# Patient Record
Sex: Male | Born: 1955 | Race: White | Hispanic: No | Marital: Married | Smoking: Never smoker
Health system: Southern US, Community
[De-identification: ages and names within clinical notes are randomized; demographics above are authoritative.]

## PROBLEM LIST (undated history)

## (undated) DIAGNOSIS — R0609 Other forms of dyspnea: Secondary | ICD-10-CM

## (undated) DIAGNOSIS — E039 Hypothyroidism, unspecified: Secondary | ICD-10-CM

## (undated) DIAGNOSIS — F32A Depression, unspecified: Secondary | ICD-10-CM

## (undated) DIAGNOSIS — Z6831 Body mass index (BMI) 31.0-31.9, adult: Secondary | ICD-10-CM

## (undated) DIAGNOSIS — I10 Essential (primary) hypertension: Secondary | ICD-10-CM

## (undated) DIAGNOSIS — K08109 Complete loss of teeth, unspecified cause, unspecified class: Secondary | ICD-10-CM

## (undated) DIAGNOSIS — E669 Obesity, unspecified: Secondary | ICD-10-CM

## (undated) DIAGNOSIS — Z973 Presence of spectacles and contact lenses: Secondary | ICD-10-CM

## (undated) DIAGNOSIS — Z794 Long term (current) use of insulin: Secondary | ICD-10-CM

## (undated) DIAGNOSIS — E782 Mixed hyperlipidemia: Secondary | ICD-10-CM

## (undated) DIAGNOSIS — G4730 Sleep apnea, unspecified: Secondary | ICD-10-CM

## (undated) DIAGNOSIS — E119 Type 2 diabetes mellitus without complications: Secondary | ICD-10-CM

## (undated) DIAGNOSIS — E079 Disorder of thyroid, unspecified: Secondary | ICD-10-CM

## (undated) HISTORY — DX: Type 2 diabetes mellitus without complications: Z79.4

## (undated) HISTORY — DX: Obesity, unspecified: E66.9

## (undated) HISTORY — DX: Body mass index (BMI) 31.0-31.9, adult: Z68.31

## (undated) HISTORY — DX: Mixed hyperlipidemia: E78.2

## (undated) HISTORY — DX: Other forms of dyspnea: R06.09

## (undated) HISTORY — DX: Essential (primary) hypertension: I10

## (undated) HISTORY — DX: Type 2 diabetes mellitus without complications: E11.9

---

## 2020-08-19 ENCOUNTER — Ambulatory Visit (INDEPENDENT_AMBULATORY_CARE_PROVIDER_SITE_OTHER)

## 2020-08-19 ENCOUNTER — Ambulatory Visit: Admission: EM | Admit: 2020-08-19 | Discharge: 2020-08-19 | Disposition: A | Discharge status: Home / Self Care

## 2020-08-19 ENCOUNTER — Other Ambulatory Visit: Payer: Self-pay

## 2020-08-19 ENCOUNTER — Encounter: Payer: Self-pay | Admitting: Emergency Medicine

## 2020-08-19 DIAGNOSIS — J988 Other specified respiratory disorders: Secondary | ICD-10-CM

## 2020-08-19 DIAGNOSIS — R059 Cough, unspecified: Secondary | ICD-10-CM | POA: Diagnosis not present

## 2020-08-19 DIAGNOSIS — R0602 Shortness of breath: Secondary | ICD-10-CM

## 2020-08-19 DIAGNOSIS — B9789 Other viral agents as the cause of diseases classified elsewhere: Secondary | ICD-10-CM

## 2020-08-19 DIAGNOSIS — R070 Pain in throat: Secondary | ICD-10-CM

## 2020-08-19 HISTORY — DX: Type 2 diabetes mellitus without complications: E11.9

## 2020-08-19 HISTORY — DX: Disorder of thyroid, unspecified: E07.9

## 2020-08-19 MED ORDER — PROMETHAZINE-DM 6.25-15 MG/5ML PO SYRP: 5.0000 mL | ORAL_SOLUTION | Freq: Every evening | ORAL | 0 refills | Status: DC | PRN

## 2020-08-19 MED ORDER — CETIRIZINE HCL 10 MG PO TABS: 10.0000 mg | ORAL_TABLET | Freq: Every day | ORAL | 0 refills | Status: DC

## 2020-08-19 MED ORDER — BENZONATATE 100 MG PO CAPS: 100.0000 mg | ORAL_CAPSULE | Freq: Three times a day (TID) | ORAL | 0 refills | Status: DC | PRN

## 2020-08-19 NOTE — Discharge Instructions (Signed)

## 2020-08-19 NOTE — ED Provider Notes (Signed)
[facility]   MRN: [id number] DOB: 10/28/1955  Subjective:   Jaqua Ching is a 65 y.o. male presenting for 2 to 3-day history of shortness of breath, cough, throat pain.  Patient states that he actually does have a longstanding history of shortness of breath and is followed by his doctor regularly for this.  Denies history of smoking, lung disorders.  Patient is a diabetic, has thyroid disorder.  Takes medications compliantly for this.  Reports there was one sick contact indirectly through one of his coworkers as one of his family members was sick but did not have Covid.  Patient denies runny or stuffy nose, ear pain, chest pain, belly pain, body aches, rash.  Has not tried medications for relief.  No current facility-administered medications for this encounter.  Current Outpatient Medications:  .  ezetimibe (ZETIA) 10 MG tablet, , Disp: , Rfl:  .  lisinopril (ZESTRIL) 5 MG tablet, , Disp: , Rfl:  .  atorvastatin (LIPITOR) 40 MG tablet, Take 1 tablet by mouth daily., Disp: , Rfl:  .  BYDUREON BCISE 2 MG/0.85ML AUIJ, Inject into the skin., Disp: , Rfl:  .  insulin glargine (LANTUS) 100 UNIT/ML injection, Inject into the skin., Disp: , Rfl:  .  METFORMIN HCL PO, Take 500 mg by mouth 2 (two) times daily., Disp: , Rfl:  .  pioglitazone (ACTOS) 15 MG tablet, Take by mouth., Disp: , Rfl:  .  SYNTHROID 88 MCG tablet, Take 88 mcg by mouth daily., Disp: , Rfl:  .  VENLAFAXINE HCL PO, Take 75 mg by mouth., Disp: , Rfl:    No Known Allergies  Past Medical History:  Diagnosis Date  . Diabetes mellitus without complication (HCC)   . Thyroid disorder      History reviewed. No pertinent surgical history.  No family history on file.  Social History   Tobacco Use  . Smoking status: Never Smoker  . Smokeless tobacco: Never Used  Vaping Use  . Vaping Use: Never used  Substance Use Topics  . Alcohol use: Yes    Comment: social  . Drug use: Never    ROS   Objective:    Vitals: BP 127/70 (BP Location: Right Arm)   Pulse 77   Temp 99.4 F (37.4 C) (Oral)   Resp 18   SpO2 94%   Physical Exam Constitutional:      General: He is not in acute distress.    Appearance: Normal appearance. He is well-developed. He is not ill-appearing, toxic-appearing or diaphoretic.  HENT:     Head: Normocephalic and atraumatic.     Right Ear: External ear normal.     Left Ear: External ear normal.     Nose: Nose normal.     Mouth/Throat:     Mouth: Mucous membranes are moist.     Pharynx: Oropharynx is clear. No oropharyngeal exudate or posterior oropharyngeal erythema.  Eyes:     General: No scleral icterus.       Right eye: No discharge.        Left eye: No discharge.     Extraocular Movements: Extraocular movements intact.     Conjunctiva/sclera: Conjunctivae normal.     Pupils: Pupils are equal, round, and reactive to light.  Cardiovascular:     Rate and Rhythm: Normal rate and regular rhythm.     Heart sounds: Normal heart sounds. No murmur heard. No friction rub. No gallop.   Pulmonary:     Effort: Pulmonary effort is normal. No  respiratory distress.     Breath sounds: No stridor. No wheezing, rhonchi or rales.     Comments: Slightly decreased lung sounds. Neurological:     Mental Status: He is alert and oriented to person, place, and time.  Psychiatric:        Mood and Affect: Mood normal.        Behavior: Behavior normal.        Thought Content: Thought content normal.        Judgment: Judgment normal.     DG Chest 2 View  Result Date: 08/19/2020 CLINICAL DATA:  Cough and shortness of breath beginning yesterday. EXAM: CHEST - 2 VIEW COMPARISON:  06/10/2019 FINDINGS: Lungs are adequately inflated and otherwise clear. Cardiomediastinal silhouette and remainder of the exam is unchanged. IMPRESSION: No active cardiopulmonary disease. Electronically Signed   By: Elberta Fortis M.D.   On: 08/19/2020 15:50     Assessment and Plan :   PDMP not  reviewed this encounter.  1. Viral respiratory illness   2. Cough   3. Throat pain   4. Shortness of breath     Will manage for viral illness such as viral URI, viral syndrome, viral rhinitis, COVID-19. Counseled patient on nature of COVID-19 including modes of transmission, diagnostic testing, management and supportive care.  Offered scripts for symptomatic relief. COVID 19 testing is pending. Counseled patient on potential for adverse effects with medications prescribed/recommended today, ER and return-to-clinic precautions discussed, patient verbalized understanding.     Wallis Bamberg, [place] 08/19/20 [id number]

## 2020-08-19 NOTE — ED Triage Notes (Signed)
Pt is present today with c/o a sore throat and a cough started yesterday. Pt denies any fever.

## 2020-08-20 LAB — NOVEL CORONAVIRUS, NAA: SARS-CoV-2, NAA: NOT DETECTED

## 2020-08-20 LAB — SARS-COV-2, NAA 2 DAY TAT

## 2020-08-24 ENCOUNTER — Telehealth: Payer: Self-pay

## 2020-08-24 NOTE — Telephone Encounter (Signed)
Pt is calling to receive his negative covid test results.

## 2021-05-10 ENCOUNTER — Other Ambulatory Visit: Payer: Self-pay | Admitting: Urology

## 2021-05-14 ENCOUNTER — Other Ambulatory Visit: Payer: Self-pay

## 2021-05-14 ENCOUNTER — Encounter (HOSPITAL_BASED_OUTPATIENT_CLINIC_OR_DEPARTMENT_OTHER): Payer: Self-pay | Admitting: Urology

## 2021-05-14 NOTE — Progress Notes (Signed)
Spoke w/ via phone for pre-op interview---pt  Lab needs dos----  I Stat and EKG             Lab results------n/a COVID test -----patient states asymptomatic no test needed Arrive at -------1000 on 05/21/2021 NPO after MN NO Solid Food.  Clear liquids from MN until---0900 05/21/2021 Med rec completed Medications to take morning of surgery -----synthroid and venlafaxine Diabetic medication -----none morning of surgery and 25 units night before surgery Patient instructed no nail polish to be worn day of surgery Patient instructed to bring photo id and insurance card day of surgery Patient aware to have Driver (ride ) / caregiver wife Dean Leon   for 24 hours after surgery  Patient Special Instructions -----n/a  Pre-Op special Istructions -----n/a Patient verbalized understanding of instructions that were given at this phone interview. Patient denies shortness of breath, chest pain, fever, cough at this phone interview.

## 2021-05-19 NOTE — Anesthesia Preprocedure Evaluation (Addendum)
Anesthesia Evaluation  Patient identified by MRN, date of birth, ID band Patient awake    Reviewed: Allergy & Precautions, NPO status , Patient's Chart, lab work & pertinent test results  Airway Mallampati: II  TM Distance: >3 FB Neck ROM: Full    Dental no notable dental hx. (+) Teeth Intact, Dental Advisory Given   Pulmonary sleep apnea ,    Pulmonary exam normal breath sounds clear to auscultation       Cardiovascular Exercise Tolerance: Good Normal cardiovascular exam Rhythm:Regular Rate:Normal     Neuro/Psych PSYCHIATRIC DISORDERS Depression    GI/Hepatic negative GI ROS, Neg liver ROS,   Endo/Other  diabetesHypothyroidism   Renal/GU negative Renal ROS     Musculoskeletal negative musculoskeletal ROS (+)   Abdominal (+) + obese (BMI 32.08),   Peds  Hematology   Anesthesia Other Findings   Reproductive/Obstetrics                            Anesthesia Physical Anesthesia Plan  ASA: 2  Anesthesia Plan: General   Post-op Pain Management: Tylenol PO (pre-op)   Induction: Intravenous  PONV Risk Score and Plan: 3 and Treatment may vary due to age or medical condition, Ondansetron, Midazolam and Dexamethasone  Airway Management Planned: LMA  Additional Equipment: None  Intra-op Plan:   Post-operative Plan:   Informed Consent:     Dental advisory given  Plan Discussed with:   Anesthesia Plan Comments:        Anesthesia Quick Evaluation

## 2021-05-20 NOTE — H&P (Signed)
Patient is a 66 year old white male who is uncircumcised. Over the last several years he has been unable to retract the foreskin. This causes pain with sexual activity and he has difficulty cleaning under foreskin. He is now here for consultation regarding possible circumcision.     ALLERGIES: None   MEDICATIONS: No Medications    GU PSH: None   NON-GU PSH: None   GU PMH: None   NON-GU PMH: Depression Diabetes Type 2 Hypercholesterolemia Hypertension Sleep Apnea    FAMILY HISTORY: Diabetes - Runs in Family   SOCIAL HISTORY: No Social History    REVIEW OF SYSTEMS:    GU Review Male:   Patient reports get up at night to urinate. Patient denies frequent urination, burning/ pain with urination, leakage of urine, stream starts and stops, trouble starting your stream, have to strain to urinate , erection problems, and penile pain.  Gastrointestinal (Upper):   Patient denies nausea, vomiting, and indigestion/ heartburn.  Gastrointestinal (Lower):   Patient denies diarrhea and constipation.  Constitutional:   Patient denies fever, night sweats, weight loss, and fatigue.  Skin:   Patient denies skin rash/ lesion and itching.  Eyes:   Patient denies double vision and blurred vision.  Ears/ Nose/ Throat:   Patient denies sore throat and sinus problems.  Hematologic/Lymphatic:   Patient denies swollen glands and easy bruising.  Cardiovascular:   Patient denies leg swelling and chest pains.  Respiratory:   Patient denies cough and shortness of breath.  Endocrine:   Patient denies excessive thirst.  Musculoskeletal:   Patient reports back pain and joint pain.   Neurological:   Patient denies headaches and dizziness.  Psychologic:   Patient denies depression and anxiety.   VITAL SIGNS:      05/08/2021 09:03 AM  Weight 230 lb / 104.33 kg  Height 71 in / 180.34 cm  BP 130/74 mmHg  Pulse 65 /min  Temperature 97.0 F / 36.1 C  BMI 32.1 kg/m   GU PHYSICAL EXAMINATION:    Scrotum: No  lesions. No edema. No cysts. No warts.  Penis: Penis is uncircumcised. Patient has tight phimosis and I cannot retract the foreskin. I did do not palpate any glanular lesions underneath the foreskin.   MULTI-SYSTEM PHYSICAL EXAMINATION:    Constitutional: Obese. No physical deformities. Normally developed. Good grooming.   Neck: Neck symmetrical, not swollen. Normal tracheal position.  Respiratory: No labored breathing, no use of accessory muscles.   Cardiovascular: Normal temperature, normal extremity pulses, no swelling, no varicosities.  Lymphatic: No enlargement of neck, axillae, groin.  Skin: No paleness, no jaundice, no cyanosis. No lesion, no ulcer, no rash.  Neurologic / Psychiatric: Oriented to time, oriented to place, oriented to person. No depression, no anxiety, no agitation.  Eyes: Normal conjunctivae. Normal eyelids.  Ears, Nose, Mouth, and Throat: Left ear no scars, no lesions, no masses. Right ear no scars, no lesions, no masses. Nose no scars, no lesions, no masses. Normal hearing. Normal lips.  Musculoskeletal: Normal gait and station of head and neck.     Complexity of Data:  Source Of History:  Patient  Records Review:   Previous Doctor Records, Previous Patient Records  Urine Test Review:   Urinalysis   PROCEDURES:          Urinalysis - 81003 Dipstick Dipstick Cont'd  Color: Yellow Bilirubin: Neg mg/dL  Appearance: Clear Ketones: Neg mg/dL  Specific Gravity: 7.371 Blood: Neg ery/uL  pH: <=5.0 Protein: Neg mg/dL  Glucose: Neg mg/dL Urobilinogen:  0.2 mg/dL    Nitrites: Neg    Leukocyte Esterase: Neg leu/uL    ASSESSMENT:      ICD-10 Details  1 GU:   Phimosis - N47.1 Acute, Complicated Injury   PLAN:           Document Letter(s):  Created for Patient: Clinical Summary         Notes:   We discussed treatment options, basically recommended circumcision as foreskin cannot be retracted. We discussed risks and benefits as outlined below. We will schedule  accordingly after the first of the year.  Circumcision consent: I have discussed with the patient the risks and benefits of the procedure including but not limited to bleeding, infection, damage to adjacent structures including the urethra wth possible need for further procedures. Risk of numbness and decreased sensation of the penis, scarring of the penile skin and pain associated with scarring. I have also discussed with the patient the alternatives to circumcision. Patient voices understanding of the risks and benefits of the above procedure and consents.

## 2021-05-21 ENCOUNTER — Ambulatory Visit (HOSPITAL_BASED_OUTPATIENT_CLINIC_OR_DEPARTMENT_OTHER)
Admission: RE | Admit: 2021-05-21 | Discharge: 2021-05-21 | Disposition: A | Discharge status: Home / Self Care | Payer: Medicare Other | Source: Ambulatory Visit | Attending: Urology | Admitting: Urology

## 2021-05-21 ENCOUNTER — Encounter (HOSPITAL_BASED_OUTPATIENT_CLINIC_OR_DEPARTMENT_OTHER)
Admission: RE | Disposition: A | Discharge status: Home / Self Care | Payer: Self-pay | Source: Ambulatory Visit | Attending: Urology

## 2021-05-21 ENCOUNTER — Encounter (HOSPITAL_BASED_OUTPATIENT_CLINIC_OR_DEPARTMENT_OTHER): Payer: Self-pay | Admitting: Urology

## 2021-05-21 ENCOUNTER — Ambulatory Visit (HOSPITAL_BASED_OUTPATIENT_CLINIC_OR_DEPARTMENT_OTHER): Payer: Medicare Other | Admitting: Anesthesiology

## 2021-05-21 DIAGNOSIS — F32A Depression, unspecified: Secondary | ICD-10-CM | POA: Insufficient documentation

## 2021-05-21 DIAGNOSIS — E119 Type 2 diabetes mellitus without complications: Secondary | ICD-10-CM | POA: Insufficient documentation

## 2021-05-21 DIAGNOSIS — E039 Hypothyroidism, unspecified: Secondary | ICD-10-CM | POA: Insufficient documentation

## 2021-05-21 DIAGNOSIS — N471 Phimosis: Secondary | ICD-10-CM | POA: Diagnosis present

## 2021-05-21 DIAGNOSIS — G4730 Sleep apnea, unspecified: Secondary | ICD-10-CM | POA: Insufficient documentation

## 2021-05-21 HISTORY — DX: Presence of spectacles and contact lenses: Z97.3

## 2021-05-21 HISTORY — DX: Presence of dental prosthetic device (complete) (partial): K08.109

## 2021-05-21 HISTORY — DX: Sleep apnea, unspecified: G47.30

## 2021-05-21 HISTORY — DX: Hypothyroidism, unspecified: E03.9

## 2021-05-21 HISTORY — PX: CIRCUMCISION: SHX1350

## 2021-05-21 HISTORY — DX: Depression, unspecified: F32.A

## 2021-05-21 LAB — POCT I-STAT, CHEM 8
BUN: 20 mg/dL (ref 8–23)
Calcium, Ion: 1.23 mmol/L (ref 1.15–1.40)
Chloride: 103 mmol/L (ref 98–111)
Creatinine, Ser: 0.6 mg/dL — ABNORMAL LOW (ref 0.61–1.24)
Glucose, Bld: 170 mg/dL — ABNORMAL HIGH (ref 70–99)
HCT: 44 % (ref 39.0–52.0)
Hemoglobin: 15 g/dL (ref 13.0–17.0)
Potassium: 4.3 mmol/L (ref 3.5–5.1)
Sodium: 138 mmol/L (ref 135–145)
TCO2: 27 mmol/L (ref 22–32)

## 2021-05-21 LAB — GLUCOSE, CAPILLARY: Glucose-Capillary: 145 mg/dL — ABNORMAL HIGH (ref 70–99)

## 2021-05-21 SURGERY — CIRCUMCISION, ADULT
Anesthesia: General | Site: Penis

## 2021-05-21 MED ORDER — ACETAMINOPHEN 10 MG/ML IV SOLN: 1000.0000 mg | Freq: Once | INTRAVENOUS | Status: DC | PRN

## 2021-05-21 MED ORDER — LIDOCAINE 2% (20 MG/ML) 5 ML SYRINGE
INTRAMUSCULAR | Status: DC | PRN
  Administered 2021-05-21: 80 mg via INTRAVENOUS

## 2021-05-21 MED ORDER — OXYCODONE HCL 5 MG PO TABS: 5.0000 mg | ORAL_TABLET | Freq: Once | ORAL | Status: DC | PRN

## 2021-05-21 MED ORDER — BUPIVACAINE HCL (PF) 0.5 % IJ SOLN
INTRAMUSCULAR | Status: AC
  Filled 2021-05-21: qty 30

## 2021-05-21 MED ORDER — 0.9 % SODIUM CHLORIDE (POUR BTL) OPTIME
TOPICAL | Status: DC | PRN
  Administered 2021-05-21: 500 mL

## 2021-05-21 MED ORDER — PROPOFOL 10 MG/ML IV BOLUS
INTRAVENOUS | Status: AC
  Filled 2021-05-21: qty 20

## 2021-05-21 MED ORDER — FENTANYL CITRATE (PF) 100 MCG/2ML IJ SOLN
INTRAMUSCULAR | Status: AC
  Filled 2021-05-21: qty 2

## 2021-05-21 MED ORDER — ONDANSETRON HCL 4 MG/2ML IJ SOLN: 4.0000 mg | Freq: Once | INTRAMUSCULAR | Status: DC | PRN

## 2021-05-21 MED ORDER — ACETAMINOPHEN 500 MG PO TABS
ORAL_TABLET | ORAL | Status: AC
  Filled 2021-05-21: qty 2

## 2021-05-21 MED ORDER — FENTANYL CITRATE (PF) 250 MCG/5ML IJ SOLN
INTRAMUSCULAR | Status: AC
  Filled 2021-05-21: qty 5

## 2021-05-21 MED ORDER — FENTANYL CITRATE (PF) 100 MCG/2ML IJ SOLN
INTRAMUSCULAR | Status: DC | PRN
  Administered 2021-05-21 (×3): 50 ug via INTRAVENOUS

## 2021-05-21 MED ORDER — CEFAZOLIN SODIUM-DEXTROSE 2-4 GM/100ML-% IV SOLN
INTRAVENOUS | Status: AC
  Filled 2021-05-21: qty 100

## 2021-05-21 MED ORDER — BUPIVACAINE HCL 0.5 % IJ SOLN
INTRAMUSCULAR | Status: DC | PRN
  Administered 2021-05-21 (×2): 10 mL

## 2021-05-21 MED ORDER — MIDAZOLAM HCL 2 MG/2ML IJ SOLN
INTRAMUSCULAR | Status: AC
  Filled 2021-05-21: qty 2

## 2021-05-21 MED ORDER — MIDAZOLAM HCL 5 MG/5ML IJ SOLN
INTRAMUSCULAR | Status: DC | PRN
  Administered 2021-05-21: 2 mg via INTRAVENOUS

## 2021-05-21 MED ORDER — DEXAMETHASONE SODIUM PHOSPHATE 10 MG/ML IJ SOLN
INTRAMUSCULAR | Status: AC
  Filled 2021-05-21: qty 1

## 2021-05-21 MED ORDER — DEXAMETHASONE SODIUM PHOSPHATE 10 MG/ML IJ SOLN
INTRAMUSCULAR | Status: DC | PRN
  Administered 2021-05-21: 10 mg via INTRAVENOUS

## 2021-05-21 MED ORDER — CEFAZOLIN SODIUM-DEXTROSE 2-4 GM/100ML-% IV SOLN
2.0000 g | INTRAVENOUS | Status: AC
  Administered 2021-05-21: 2 g via INTRAVENOUS

## 2021-05-21 MED ORDER — EPHEDRINE SULFATE-NACL 50-0.9 MG/10ML-% IV SOSY
PREFILLED_SYRINGE | INTRAVENOUS | Status: DC | PRN
  Administered 2021-05-21 (×2): 5 mg via INTRAVENOUS

## 2021-05-21 MED ORDER — HYDROMORPHONE HCL 1 MG/ML IJ SOLN: 0.2500 mg | INTRAMUSCULAR | Status: DC | PRN

## 2021-05-21 MED ORDER — PROPOFOL 10 MG/ML IV BOLUS
INTRAVENOUS | Status: DC | PRN
  Administered 2021-05-21: 50 mg via INTRAVENOUS
  Administered 2021-05-21: 150 mg via INTRAVENOUS

## 2021-05-21 MED ORDER — LIDOCAINE 2% (20 MG/ML) 5 ML SYRINGE
INTRAMUSCULAR | Status: AC
  Filled 2021-05-21: qty 5

## 2021-05-21 MED ORDER — ACETAMINOPHEN 500 MG PO TABS
1000.0000 mg | ORAL_TABLET | Freq: Once | ORAL | Status: AC
  Administered 2021-05-21: 1000 mg via ORAL

## 2021-05-21 MED ORDER — ONDANSETRON HCL 4 MG/2ML IJ SOLN
INTRAMUSCULAR | Status: AC
  Filled 2021-05-21: qty 2

## 2021-05-21 MED ORDER — TRAMADOL HCL 50 MG PO TABS: 50.0000 mg | ORAL_TABLET | Freq: Four times a day (QID) | ORAL | 0 refills | Status: AC | PRN

## 2021-05-21 MED ORDER — LACTATED RINGERS IV SOLN: INTRAVENOUS | Status: DC

## 2021-05-21 MED ORDER — OXYCODONE HCL 5 MG/5ML PO SOLN: 5.0000 mg | Freq: Once | ORAL | Status: DC | PRN

## 2021-05-21 MED ORDER — ONDANSETRON HCL 4 MG/2ML IJ SOLN
INTRAMUSCULAR | Status: DC | PRN
  Administered 2021-05-21: 4 mg via INTRAVENOUS

## 2021-05-21 MED ORDER — BACITRACIN-NEOMYCIN-POLYMYXIN 400-5-5000 EX OINT
TOPICAL_OINTMENT | CUTANEOUS | Status: DC | PRN
  Administered 2021-05-21: 1 via TOPICAL

## 2021-05-21 MED ORDER — AMISULPRIDE (ANTIEMETIC) 5 MG/2ML IV SOLN: 10.0000 mg | Freq: Once | INTRAVENOUS | Status: DC | PRN

## 2021-05-21 SURGICAL SUPPLY — 28 items
BLADE CLIPPER SENSICLIP SURGIC (BLADE) ×1 IMPLANT
BLADE SURG 15 STRL LF DISP TIS (BLADE) ×1 IMPLANT
BLADE SURG 15 STRL SS (BLADE) ×2
BNDG COHESIVE 1X5 TAN STRL LF (GAUZE/BANDAGES/DRESSINGS) ×1 IMPLANT
BNDG CONFORM 2 STRL LF (GAUZE/BANDAGES/DRESSINGS) ×2 IMPLANT
COVER BACK TABLE 60X90IN (DRAPES) ×2 IMPLANT
COVER MAYO STAND STRL (DRAPES) ×2 IMPLANT
DRAPE LAPAROTOMY 100X72 PEDS (DRAPES) ×2 IMPLANT
ELECT REM PT RETURN 9FT ADLT (ELECTROSURGICAL) ×2
ELECTRODE REM PT RTRN 9FT ADLT (ELECTROSURGICAL) ×1 IMPLANT
GAUZE 4X4 16PLY ~~LOC~~+RFID DBL (SPONGE) ×2 IMPLANT
GAUZE PETROLATUM 1 X8 (GAUZE/BANDAGES/DRESSINGS) ×2 IMPLANT
GLOVE SURG ENC MOIS LTX SZ7.5 (GLOVE) ×2 IMPLANT
GLOVE SURG UNDER POLY LF SZ7 (GLOVE) ×3 IMPLANT
GOWN STRL REUS W/TWL LRG LVL3 (GOWN DISPOSABLE) ×2 IMPLANT
GOWN STRL REUS W/TWL XL LVL3 (GOWN DISPOSABLE) ×2 IMPLANT
KIT TURNOVER CYSTO (KITS) ×2 IMPLANT
NDL HYPO 25X1 1.5 SAFETY (NEEDLE) IMPLANT
NEEDLE HYPO 25X1 1.5 SAFETY (NEEDLE) ×2 IMPLANT
NS IRRIG 500ML POUR BTL (IV SOLUTION) ×2 IMPLANT
PACK BASIN DAY SURGERY FS (CUSTOM PROCEDURE TRAY) ×2 IMPLANT
PENCIL SMOKE EVACUATOR (MISCELLANEOUS) ×2 IMPLANT
SOL PREP POV-IOD 4OZ 10% (MISCELLANEOUS) ×1 IMPLANT
SUT CHROMIC 3 0 SH 27 (SUTURE) ×2 IMPLANT
SUT CHROMIC 4 0 RB 1X27 (SUTURE) ×3 IMPLANT
SYR CONTROL 10ML LL (SYRINGE) ×1 IMPLANT
TOWEL OR 17X26 10 PK STRL BLUE (TOWEL DISPOSABLE) ×2 IMPLANT
TRAY DSU PREP LF (CUSTOM PROCEDURE TRAY) ×2 IMPLANT

## 2021-05-21 NOTE — Discharge Instructions (Signed)
NO TYLENOL CONTAINING PRODUCTS UNTIL AFTER 4:45 PM TODAY.      Circumcision-Home Care Instructions  The following instructions have been prepared to help you care for yourself upon your return home today.   Wound Care & Hygiene:   You may apply ice to the penis.  This may help to decrease swelling.  Remove the dressing tomorrow.  If the dressing falls off before then, leave it off.  You may shower or bathe in 48 hours  Gently wash the penis with soap and water.  The stitches do not need to be removed.  Activity:  Do not drive or operate any equipment today.  The effects of anesthesia are still present, drowsiness may result.  Rest today, not necessarily flat bed rest, just take it easy.  You may resume your normal activity in one to two days or as indicated by your physician.  Sexual Activity:  Erection and sexual relations should be avoided for 2 weeks.  Return to Work:  One to two days or as indicated by your physician.  Diet:  Drink liquids or eat a very light diet this evening.  You may resume a regular diet tomorrow.  General Expectations of your surgery:  You may have a small amount of bleeding The penis will be swollen and bruised for approximately one week You may wake during the night with an erection, usually this is caused by having a full bladder so you should try to urinate (pass your water) to relieve the erection or apply ice to the penis  Unexpected Observations - Call your doctor if these occur! Persistent or heavy bleeding Temperature of 101 degrees or more Severe pain not relieved by medication      Post Anesthesia Home Care Instructions  Activity: Get plenty of rest for the remainder of the day. A responsible individual must stay with you for 24 hours following the procedure.  For the next 24 hours, DO NOT: -Drive a car -Advertising copywriter -Drink alcoholic beverages -Take any medication unless instructed by your physician -Make any legal decisions  or sign important papers.  Meals: Start with liquid foods such as gelatin or soup. Progress to regular foods as tolerated. Avoid greasy, spicy, heavy foods. If nausea and/or vomiting occur, drink only clear liquids until the nausea and/or vomiting subsides. Call your physician if vomiting continues.  Special Instructions/Symptoms: Your throat may feel dry or sore from the anesthesia or the breathing tube placed in your throat during surgery. If this causes discomfort, gargle with warm salt water. The discomfort should disappear within 24 hours.

## 2021-05-21 NOTE — Interval H&P Note (Signed)
History and Physical Interval Note:  05/21/2021 9:32 AM  Dean Leon  has presented today for surgery, with the diagnosis of PHIMOSIS.  The various methods of treatment have been discussed with the patient and family. After consideration of risks, benefits and other options for treatment, the patient has consented to  Procedure(s): CIRCUMCISION ADULT (N/A) as a surgical intervention.  The patient's history has been reviewed, patient examined, no change in status, stable for surgery.  I have reviewed the patient's chart and labs.  Questions were answered to the patient's satisfaction.     [name]

## 2021-05-21 NOTE — Transfer of Care (Signed)
Immediate Anesthesia Transfer of Care Note  Patient: Dean Leon  Procedure(s) Performed: CIRCUMCISION ADULT (Penis)  Patient Location: PACU  Anesthesia Type:General  Level of Consciousness: awake, alert , oriented and patient cooperative  Airway & Oxygen Therapy: Patient Spontanous Breathing  Post-op Assessment: Report given to RN and Post -op Vital signs reviewed and stable  Post vital signs: Reviewed and stable  Last Vitals:  Vitals Value Taken Time  BP    Temp    Pulse 63 05/21/21 1230  Resp 12 05/21/21 1230  SpO2 89 % 05/21/21 1230  Vitals shown include unvalidated device data.  Last Pain:  Vitals:   05/21/21 0936  TempSrc: Oral  PainSc: 2       Patients Stated Pain Goal: 5 (05/21/21 0936)  Complications: No notable events documented.

## 2021-05-21 NOTE — Op Note (Signed)
Preoperative diagnosis:  1.  Phimosis  Postoperative diagnosis: 1.  Same  Procedure(s): 1.  Adult circumcision  Surgeon: Dr. Karoline Caldwell  Anesthesia: General  Complications: None  EBL: Minimal  Specimens: Penile foreskin  Disposition of specimens: To pathology  Intraoperative findings: Fibrotic foreskin circumcision performed without difficulty  Indication: Patient is a 66 year old white male with penile phimosis.  His presents now to undergo circumcision.  Description of procedure:  After obtaining informed consent for the patient was taken major or suite placed under general anesthesia.  Placed in supine position genitalia prepped and draped in usual sterile fashion.  Proper pause timeout was performed.  The foreskin could be retracted but it was somewhat difficult due to the tight fibrotic band.  Foreskin was pulled back over the head of the penis and a marking pen was utilized to outline the coronal sulcus which could be seen through the overlying foreskin.  Circumcising incision was then made over this line.  The foreskin was then retracted and similar circumcising incision was made approximately 2 to 3 mm beneath the coronal sulcus circumferentially.  The ring of skin between the 2 incisions were then sharply dissected free in the avascular plane and excised.  Hemostasis was obtained with Bovie cautery.  Foreskin of the shaft was then reapproximated to the skin beneath the coronal sulcus utilizing interrupted 4 and 3-0 chromic sutures.  Care was taken to align the frenular line ventrally.  In this way foreskin was completely reanastomosed to the foreskin.  Good hemostasis was noted.  A Vaseline nonstick dressing was placed along with Curlex 2 inch dressing and Coban compression dressing.  Half percent plain Marcaine block dorsal block was performed at termination of the case for postoperative anesthesia.  Patient was awake from anesthesia taken back to the recovery room in stable  condition.  No immediate complication

## 2021-05-21 NOTE — Anesthesia Procedure Notes (Signed)
Procedure Name: LMA Insertion Date/Time: 05/21/2021 11:30 AM Performed by: Rogers Blocker, CRNA Pre-anesthesia Checklist: Patient identified, Emergency Drugs available, Suction available and Patient being monitored Patient Re-evaluated:Patient Re-evaluated prior to induction Oxygen Delivery Method: Circle System Utilized Preoxygenation: Pre-oxygenation with 100% oxygen Induction Type: IV induction Ventilation: Mask ventilation without difficulty LMA: LMA inserted LMA Size: 5.0 Number of attempts: 1 Placement Confirmation: positive ETCO2 Tube secured with: Tape Dental Injury: Teeth and Oropharynx as per pre-operative assessment

## 2021-05-22 ENCOUNTER — Encounter (HOSPITAL_BASED_OUTPATIENT_CLINIC_OR_DEPARTMENT_OTHER): Payer: Self-pay | Admitting: Urology

## 2021-05-22 LAB — SURGICAL PATHOLOGY

## 2021-05-22 NOTE — Anesthesia Postprocedure Evaluation (Signed)
Anesthesia Post Note  Patient: Dean Leon  Procedure(s) Performed: CIRCUMCISION ADULT (Penis)     Patient location during evaluation: PACU Anesthesia Type: General Level of consciousness: awake and alert Pain management: pain level controlled Vital Signs Assessment: post-procedure vital signs reviewed and stable Respiratory status: spontaneous breathing, nonlabored ventilation, respiratory function stable and patient connected to nasal cannula oxygen Cardiovascular status: blood pressure returned to baseline and stable Postop Assessment: no apparent nausea or vomiting Anesthetic complications: no   No notable events documented.  Last Vitals:  Vitals:   05/21/21 1300 05/21/21 1324  BP: 125/77 129/75  Pulse: (!) 55 61  Resp: 10 14  Temp:  (!) 35.8 C  SpO2: 95% 93%    Last Pain:  Vitals:   05/22/21 0957  TempSrc:   PainSc: 1                  [name] L Asante Blanda

## 2021-07-05 ENCOUNTER — Other Ambulatory Visit: Payer: Self-pay

## 2022-03-02 IMAGING — DX DG CHEST 2V
2 series · 2 of 2 positions shown · non-contrast
Comparison: 06/10/2019

CLINICAL DATA: Cough and shortness of breath beginning yesterday.

EXAM:
CHEST - 2 VIEW

[chest pa]
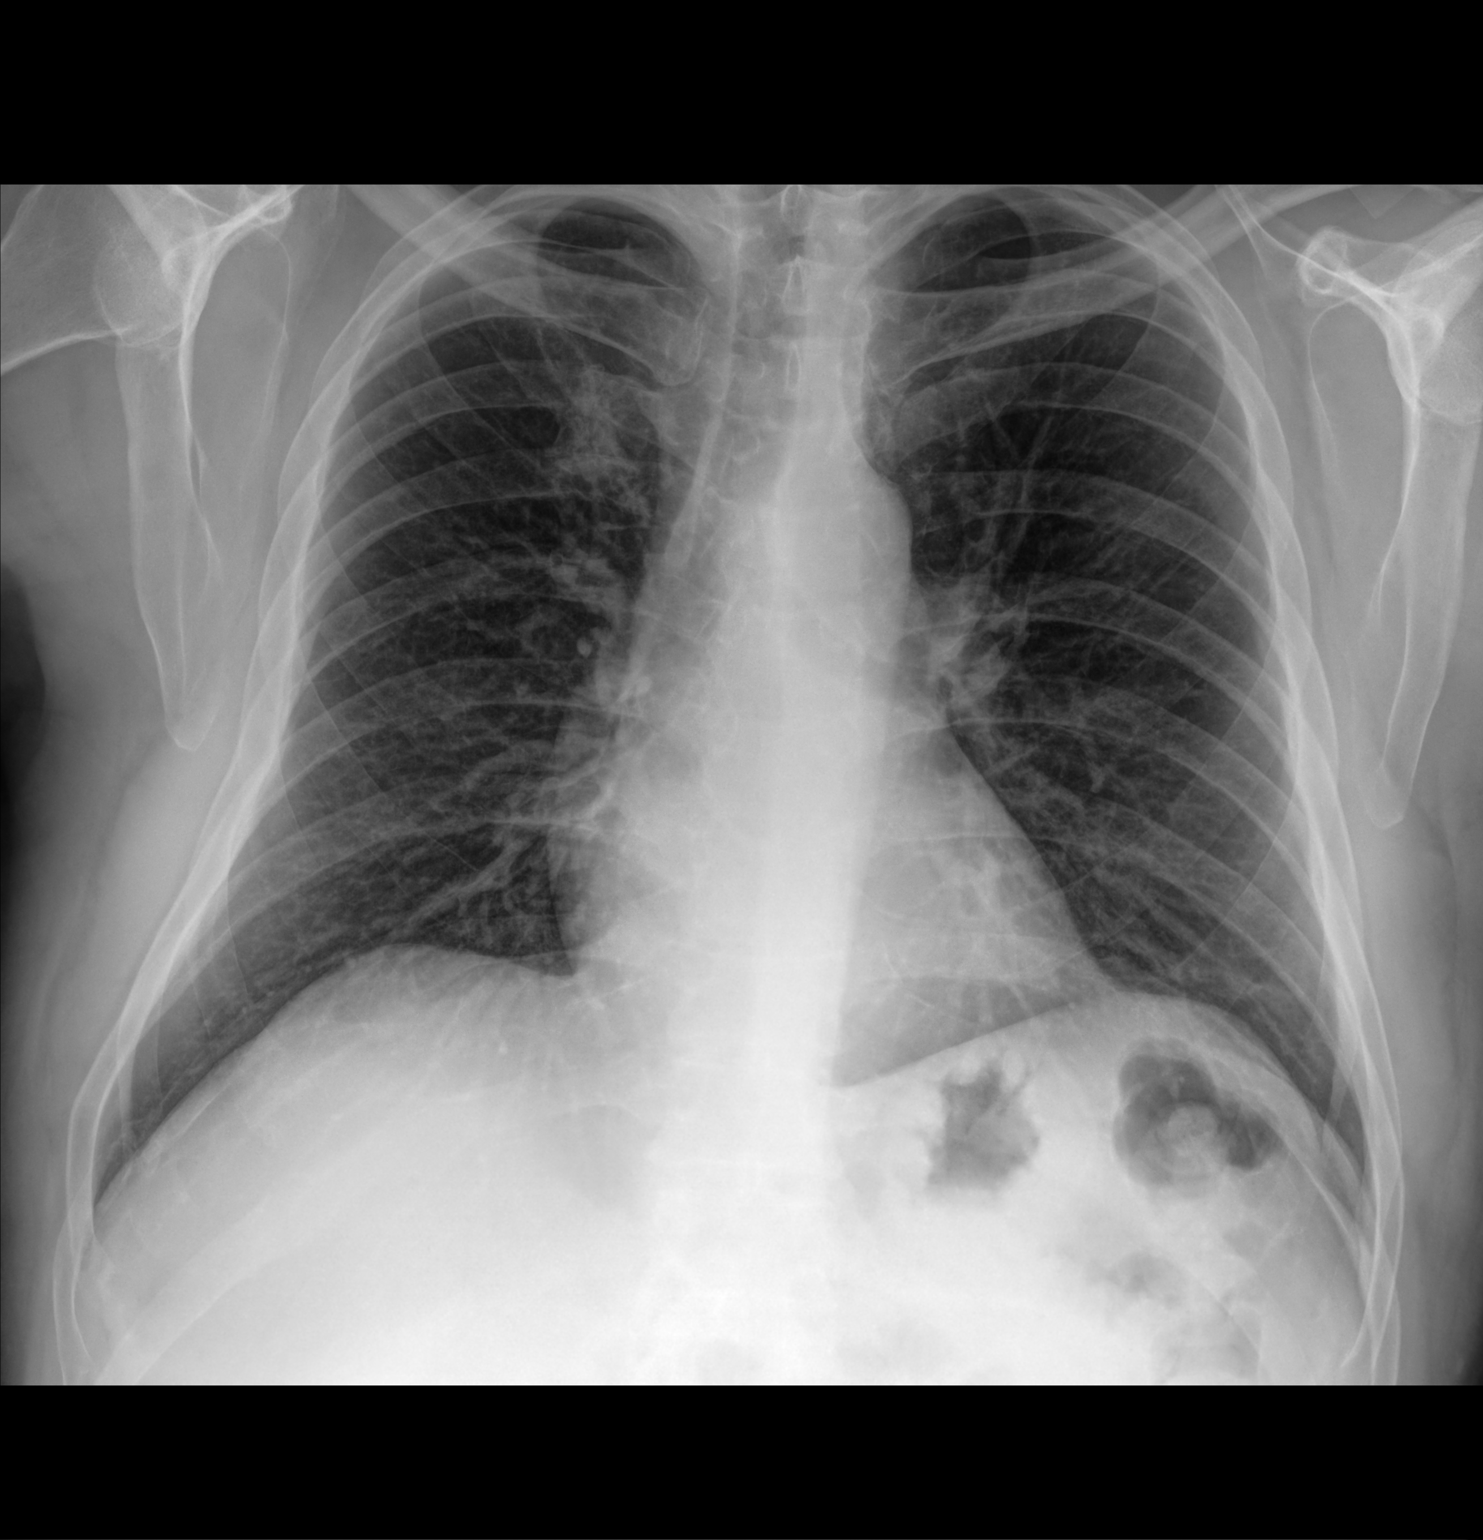

[chest lat]
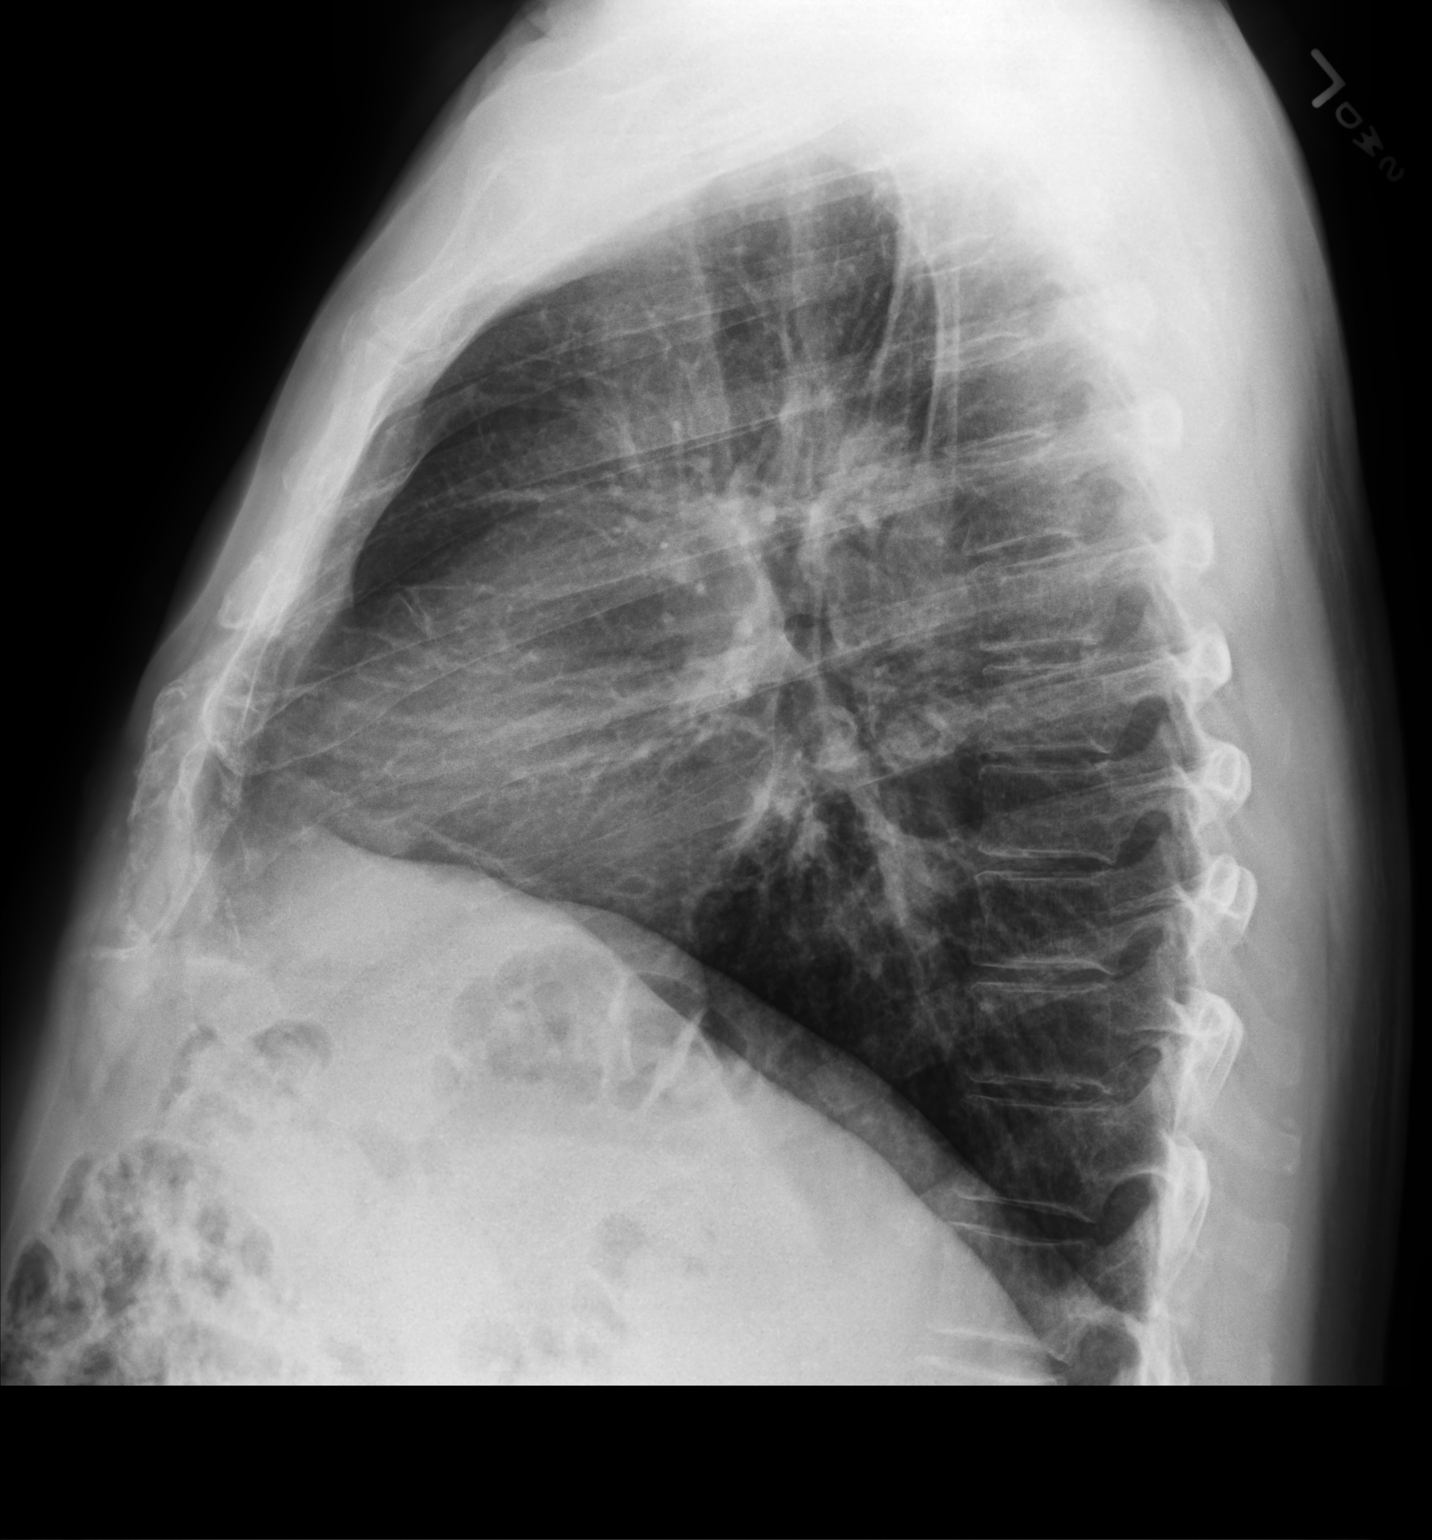

[2 of 2 positions shown; findings below may reference images not displayed]

FINDINGS: Lungs are adequately inflated and otherwise clear. Cardiomediastinal
silhouette and remainder of the exam is unchanged.
IMPRESSION: No active cardiopulmonary disease.

## 2023-02-03 DIAGNOSIS — Z139 Encounter for screening, unspecified: Secondary | ICD-10-CM | POA: Diagnosis not present

## 2023-02-03 DIAGNOSIS — E785 Hyperlipidemia, unspecified: Secondary | ICD-10-CM | POA: Diagnosis not present

## 2023-02-03 DIAGNOSIS — I10 Essential (primary) hypertension: Secondary | ICD-10-CM | POA: Diagnosis not present

## 2023-02-03 DIAGNOSIS — Z794 Long term (current) use of insulin: Secondary | ICD-10-CM | POA: Diagnosis not present

## 2023-02-03 DIAGNOSIS — E538 Deficiency of other specified B group vitamins: Secondary | ICD-10-CM | POA: Diagnosis not present

## 2023-02-03 DIAGNOSIS — E039 Hypothyroidism, unspecified: Secondary | ICD-10-CM | POA: Diagnosis not present

## 2023-02-03 DIAGNOSIS — Z9181 History of falling: Secondary | ICD-10-CM | POA: Diagnosis not present

## 2023-02-03 DIAGNOSIS — E559 Vitamin D deficiency, unspecified: Secondary | ICD-10-CM | POA: Diagnosis not present

## 2023-02-03 DIAGNOSIS — E1165 Type 2 diabetes mellitus with hyperglycemia: Secondary | ICD-10-CM | POA: Diagnosis not present

## 2023-02-16 DIAGNOSIS — Z79899 Other long term (current) drug therapy: Secondary | ICD-10-CM | POA: Diagnosis not present

## 2023-02-16 DIAGNOSIS — Z23 Encounter for immunization: Secondary | ICD-10-CM | POA: Diagnosis not present

## 2023-02-16 DIAGNOSIS — Z794 Long term (current) use of insulin: Secondary | ICD-10-CM | POA: Diagnosis not present

## 2023-02-16 DIAGNOSIS — F32A Depression, unspecified: Secondary | ICD-10-CM | POA: Diagnosis not present

## 2023-02-16 DIAGNOSIS — E119 Type 2 diabetes mellitus without complications: Secondary | ICD-10-CM | POA: Diagnosis not present

## 2023-02-16 DIAGNOSIS — E782 Mixed hyperlipidemia: Secondary | ICD-10-CM | POA: Diagnosis not present

## 2023-02-16 DIAGNOSIS — E039 Hypothyroidism, unspecified: Secondary | ICD-10-CM | POA: Diagnosis not present

## 2023-06-01 DIAGNOSIS — I10 Essential (primary) hypertension: Secondary | ICD-10-CM | POA: Diagnosis not present

## 2023-06-01 DIAGNOSIS — F32A Depression, unspecified: Secondary | ICD-10-CM | POA: Diagnosis not present

## 2023-06-01 DIAGNOSIS — E782 Mixed hyperlipidemia: Secondary | ICD-10-CM | POA: Diagnosis not present

## 2023-06-01 DIAGNOSIS — E119 Type 2 diabetes mellitus without complications: Secondary | ICD-10-CM | POA: Diagnosis not present

## 2023-06-01 DIAGNOSIS — E039 Hypothyroidism, unspecified: Secondary | ICD-10-CM | POA: Diagnosis not present

## 2023-06-01 DIAGNOSIS — Z794 Long term (current) use of insulin: Secondary | ICD-10-CM | POA: Diagnosis not present

## 2023-08-31 DIAGNOSIS — F32A Depression, unspecified: Secondary | ICD-10-CM | POA: Diagnosis not present

## 2023-08-31 DIAGNOSIS — E119 Type 2 diabetes mellitus without complications: Secondary | ICD-10-CM | POA: Diagnosis not present

## 2023-08-31 DIAGNOSIS — I10 Essential (primary) hypertension: Secondary | ICD-10-CM | POA: Diagnosis not present

## 2023-08-31 DIAGNOSIS — Z125 Encounter for screening for malignant neoplasm of prostate: Secondary | ICD-10-CM | POA: Diagnosis not present

## 2023-08-31 DIAGNOSIS — E039 Hypothyroidism, unspecified: Secondary | ICD-10-CM | POA: Diagnosis not present

## 2023-08-31 DIAGNOSIS — Z794 Long term (current) use of insulin: Secondary | ICD-10-CM | POA: Diagnosis not present

## 2023-08-31 DIAGNOSIS — E782 Mixed hyperlipidemia: Secondary | ICD-10-CM | POA: Diagnosis not present

## 2023-09-14 DIAGNOSIS — J342 Deviated nasal septum: Secondary | ICD-10-CM | POA: Diagnosis not present

## 2023-09-14 DIAGNOSIS — H9072 Mixed conductive and sensorineural hearing loss, unilateral, left ear, with unrestricted hearing on the contralateral side: Secondary | ICD-10-CM | POA: Diagnosis not present

## 2023-09-14 DIAGNOSIS — H9122 Sudden idiopathic hearing loss, left ear: Secondary | ICD-10-CM | POA: Diagnosis not present

## 2023-09-14 DIAGNOSIS — I10 Essential (primary) hypertension: Secondary | ICD-10-CM | POA: Diagnosis not present

## 2023-09-14 DIAGNOSIS — E039 Hypothyroidism, unspecified: Secondary | ICD-10-CM | POA: Diagnosis not present

## 2024-01-25 DIAGNOSIS — Z23 Encounter for immunization: Secondary | ICD-10-CM | POA: Diagnosis not present

## 2024-01-25 DIAGNOSIS — E119 Type 2 diabetes mellitus without complications: Secondary | ICD-10-CM | POA: Diagnosis not present

## 2024-01-25 DIAGNOSIS — F32A Depression, unspecified: Secondary | ICD-10-CM | POA: Diagnosis not present

## 2024-01-25 DIAGNOSIS — I10 Essential (primary) hypertension: Secondary | ICD-10-CM | POA: Diagnosis not present

## 2024-01-25 DIAGNOSIS — Z794 Long term (current) use of insulin: Secondary | ICD-10-CM | POA: Diagnosis not present

## 2024-01-25 DIAGNOSIS — E039 Hypothyroidism, unspecified: Secondary | ICD-10-CM | POA: Diagnosis not present

## 2024-01-25 DIAGNOSIS — E782 Mixed hyperlipidemia: Secondary | ICD-10-CM | POA: Diagnosis not present

## 2024-01-26 ENCOUNTER — Other Ambulatory Visit: Payer: Self-pay

## 2024-01-26 DIAGNOSIS — Z973 Presence of spectacles and contact lenses: Secondary | ICD-10-CM | POA: Insufficient documentation

## 2024-01-26 DIAGNOSIS — E039 Hypothyroidism, unspecified: Secondary | ICD-10-CM | POA: Insufficient documentation

## 2024-01-26 DIAGNOSIS — Z6831 Body mass index (BMI) 31.0-31.9, adult: Secondary | ICD-10-CM | POA: Insufficient documentation

## 2024-01-26 DIAGNOSIS — I10 Essential (primary) hypertension: Secondary | ICD-10-CM | POA: Insufficient documentation

## 2024-01-26 DIAGNOSIS — R0609 Other forms of dyspnea: Secondary | ICD-10-CM | POA: Insufficient documentation

## 2024-01-26 DIAGNOSIS — G4730 Sleep apnea, unspecified: Secondary | ICD-10-CM | POA: Insufficient documentation

## 2024-01-26 DIAGNOSIS — F32A Depression, unspecified: Secondary | ICD-10-CM | POA: Insufficient documentation

## 2024-01-26 DIAGNOSIS — E079 Disorder of thyroid, unspecified: Secondary | ICD-10-CM | POA: Insufficient documentation

## 2024-01-26 DIAGNOSIS — E119 Type 2 diabetes mellitus without complications: Secondary | ICD-10-CM | POA: Insufficient documentation

## 2024-01-26 DIAGNOSIS — E669 Obesity, unspecified: Secondary | ICD-10-CM | POA: Insufficient documentation

## 2024-01-26 DIAGNOSIS — Z794 Long term (current) use of insulin: Secondary | ICD-10-CM | POA: Insufficient documentation

## 2024-01-26 DIAGNOSIS — E782 Mixed hyperlipidemia: Secondary | ICD-10-CM | POA: Insufficient documentation

## 2024-01-26 DIAGNOSIS — K08109 Complete loss of teeth, unspecified cause, unspecified class: Secondary | ICD-10-CM | POA: Insufficient documentation

## 2024-01-27 ENCOUNTER — Ambulatory Visit

## 2024-01-27 VITALS — BP 124/70 | HR 65 | Ht 71.0 in | Wt 231.8 lb

## 2024-01-27 DIAGNOSIS — R600 Localized edema: Secondary | ICD-10-CM | POA: Diagnosis not present

## 2024-01-27 DIAGNOSIS — E782 Mixed hyperlipidemia: Secondary | ICD-10-CM | POA: Diagnosis not present

## 2024-01-27 DIAGNOSIS — R0609 Other forms of dyspnea: Secondary | ICD-10-CM | POA: Diagnosis not present

## 2024-01-27 HISTORY — DX: Localized edema: R60.0

## 2024-01-27 MED ORDER — ASPIRIN 81 MG PO TBEC: 81.0000 mg | DELAYED_RELEASE_TABLET | Freq: Every day | ORAL | 3 refills | Status: AC

## 2024-01-27 MED ORDER — METOPROLOL TARTRATE 50 MG PO TABS
50.0000 mg | ORAL_TABLET | Freq: Once | ORAL | 0 refills | Status: DC
Start: 2024-01-27 — End: 2024-03-25

## 2024-01-27 NOTE — Assessment & Plan Note (Signed)
 Likely multifactorial in the setting of progressive age-related deconditioning versus anginal equivalent versus diastolic/systolic dysfunction. Extracardiac causes secondary to pulmonary disease in the setting of chronic occupational exposure to toxins while in [employer].  - Given his overall cardiovascular risk factors we will proceed cardiac evaluation with transthoracic echocardiogram to assess cardiac structure and function and a cardiac CT coronary angiogram to rule out any significant obstructive CAD.  Advised him to start taking aspirin  81 mg once daily and let us  know if she has any side effect such as bleeding bruising or any other intolerance such as gastritis related symptoms.  Advised him to cut down on salt intake. Advised him to cut down on soda intake.

## 2024-01-27 NOTE — Progress Notes (Addendum)
 Cardiology Consultation:    Date:  01/27/2024   ID:  Dean Leon, DOB [birth year], MRN [id number]  PCP:  Stephanie Charlene LITTIE, MD  Cardiologist:  Alean JONELLE Kobus, MD   Referring MD: Stephanie Charlene LITTIE, MD   No chief complaint on file.    ASSESSMENT AND PLAN:   [name] 68 year old male patient with history of diabetes mellitus, hypertension, obesity, hyperlipidemia, hypothyroidism and depression.  Excess caffeinated soda consumption. Denies any prior history of CAD, CHF, MI, CVA. Mentions chronic dyspnea on exertion over the past 20 years but worse over the past year.  Problem List Items Addressed This Visit     Dyspnea on exertion - Primary   Likely multifactorial in the setting of progressive age-related deconditioning versus anginal equivalent versus diastolic/systolic dysfunction. Extracardiac causes secondary to pulmonary disease in the setting of chronic occupational exposure to toxins while in [employer].  - Given his overall cardiovascular risk factors we will proceed cardiac evaluation with transthoracic echocardiogram to assess cardiac structure and function and a cardiac CT coronary angiogram to rule out any significant obstructive CAD.  Advised him to start taking aspirin  81 mg once daily and let us  know if she has any side effect such as bleeding bruising or any other intolerance such as gastritis related symptoms.  Advised him to cut down on salt intake. Advised him to cut down on soda intake.        Relevant Orders   CT CORONARY MORPH W/CTA COR W/SCORE W/CA W/CM &/OR WO/CM   Basic Metabolic Panel (BMET)   ECHOCARDIOGRAM COMPLETE   Mixed hyperlipidemia   Lipid panel shows good control. Continue atorvastatin and Zetia.       Relevant Medications   aspirin  EC 81 MG tablet   Other Relevant Orders   EKG 12-Lead (Completed)   CT CORONARY MORPH W/CTA COR W/SCORE W/CA W/CM &/OR WO/CM   Basic Metabolic Panel (BMET)   ECHOCARDIOGRAM COMPLETE   Bilateral lower  extremity edema   Likely related to diastolic dysfunction and in the setting of longstanding history of hypertension. Advised to cut down salt intake. Will assess with transthoracic echocardiogram.        Addendum 02/29/2024: - Cardiac evaluation with echocardiogram 02/29/2024 notes normal biventricular function without significant valve abnormalities. - Cardiac CT coronary angiogram from 02/18/2024 shows no significant coronary atherosclerosis, calcium score 0, CAD RADS 0 study. Extracardiac findings are notable for heterogenous pulmonary parenchyma and bronchial thickening.  Could reflect findings of bronchitis. Also given his history of significant exposure to air pollutants, recommend further evaluation with PCP with regards to pulmonary causes of his dyspnea on exertion.  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  Continue to follow-up with his PCP and he can see us  on an as-needed basis.  History of Present Illness:    Dean Leon is a 68 y.o. male who is being seen today for the evaluation of dyspnea on exertion at the request of Hamrick, Charlene LITTIE, MD.  Pleasant gentleman here for the visit by himself.  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  Employed full-time.  He is a Research scientist (life sciences) and was in [facility] and reports exposure to significant air pollutants.  Has history of diabetes mellitus, hypertension, obesity, hyperlipidemia, hypothyroidism and depression. Denies any prior history of  CAD, CHF, MI, CVA. Mentions chronic dyspnea on exertion over the past 20 years but worse over the past year.  Mentions with progressive symptoms of dyspnea over the past year more noticeable and when he is walking his routine distances at this will going up and down inclines he tends to observe  getting easily tired and out of. At times associated with a sense of chest discomfort but denies any significant chest pain.  Denies any palpitations, lightheadedness, dizziness or syncopal episodes.  Does have mild bilateral pitting pedal edema which he mentions has been chronic.  Does eat salty foods, typically eats 1 meal but eats out on most days. Does not smoke. Drinks alcohol occasionally on social events but no over consumption. Reports no other substance abuse. Drinks 6 to 8 cans of 12 ounce diet sodas a day.  Mentions good compliance with medications. Previously took aspirin  full-strength and noted some bruising but self discontinued.  EKG in the clinic today shows sinus rhythm heart rate 65/min PR interval 154 ms, no ischemic changes.  Normal QRS duration 82 ms and normal QTc.  Labs from 01/25/2024 hemoglobin 14.7, hematocrit 41, platelets 302 and WBC 6.2. BUN 22, creatinine 0.8 and eGFR greater than 60. Sodium 139 and potassium 4.5. Calcium mildly elevated 10.8. Hemoglobin A1c elevated 10.3 TSH 3.7. Lipid panel with total cholesterol 99, triglycerides 71, HDL 44 and LDL 41.  Past Medical History:  Diagnosis Date   BMI 31.0-31.9,adult    Depression    Diabetes mellitus without complication (HCC)    Dyspnea on exertion    Full dentures    Hypertension    Hypothyroidism    Mixed hyperlipidemia    Obesity    Sleep apnea    had a study awhile back but doesnt use CPAP   Thyroid  disorder    Type 2 diabetes mellitus with insulin therapy (HCC)    Wears glasses    sometimes 05/14/2021    Past Surgical History:  Procedure Laterality Date   CIRCUMCISION N/A 05/21/2021   Procedure: CIRCUMCISION ADULT;  Surgeon: Rosalind Zachary NOVAK, MD;  Location: [facility];  Service: Urology;  Laterality: N/A;    Current Medications: Current Meds  Medication Sig   aspirin  EC 81 MG tablet Take 1 tablet (81 mg total) by mouth daily. Swallow whole.   atorvastatin  (LIPITOR) 40 MG tablet Take 1 tablet by mouth daily. Afternoon takes it   ezetimibe (ZETIA) 10 MG tablet Take 10 mg by mouth daily.   insulin glargine (LANTUS) 100 UNIT/ML injection Inject 70 Units into the skin at bedtime.   lisinopril (ZESTRIL) 5 MG tablet Take 5 mg by mouth daily.   metFORMIN (GLUCOPHAGE) 500 MG tablet Take 1,000 mg by mouth 2 (two) times daily.   pioglitazone (ACTOS) 45 MG tablet Take 45 mg by mouth daily.   SYNTHROID 88 MCG tablet Take 88 mcg by mouth daily.   venlafaxine XR (EFFEXOR-XR) 75 MG 24 hr capsule Take 75 mg by mouth daily.     Allergies:   Patient has no known allergies.   Social History   Socioeconomic History   Marital status: Married    Spouse name: Not on file   Number of children: Not on file   Years of education: Not on file   Highest education level: Not on file  Occupational History   Not on file  Tobacco Use   Smoking status: Never   Smokeless tobacco: Never  Vaping Use   Vaping status: Never Used  Substance and Sexual Activity   Alcohol use: Yes    Comment: social   Drug use: Never   Sexual activity: Not on file  Other Topics Concern   Not on file  Social History Narrative   Not on file   Social Drivers of Health   Financial Resource Strain: Not on file  Food Insecurity: Not on file  Transportation Needs: Not on file  Physical Activity: Not on file  Stress: Not on file  Social Connections: Not on file     Family History: The patient's family history includes Cancer in his paternal aunt and paternal uncle; Diabetes in his father, mother, paternal grandmother, and sister; Hypertension in his father and paternal grandmother. There is no history of Heart disease. ROS:   Please see the history of present illness.    All 14 point review of systems negative except as described per history of present illness.  EKGs/Labs/Other Studies Reviewed:    The following studies were reviewed today:   EKG:  EKG  Interpretation Date/Time:  Wednesday January 27 2024 08:30:15 EDT Ventricular Rate:  65 PR Interval:  154 QRS Duration:  82 QT Interval:  444 QTC Calculation: 461 R Axis:   64  Text Interpretation: Normal sinus rhythm Normal ECG When compared with ECG of 21-May-2021 09:10, No significant change was found Confirmed by Liborio Hai reddy [id number]) on 01/27/2024 8:38:29 AM    Recent Labs: No results found for requested labs within last 365 days.  Recent Lipid Panel No results found for: CHOL, TRIG, HDL, CHOLHDL, VLDL, LDLCALC, LDLDIRECT  Physical Exam:    VS:  BP 124/70   Pulse 65   Ht 5' 11 (1.803 m)   Wt 231 lb 12.8 oz (105.1 kg)   SpO2 96%   BMI 32.33 kg/m     Wt Readings from Last 3 Encounters:  01/27/24 231 lb 12.8 oz (105.1 kg)  05/21/21 227 lb 6.4 oz (103.1 kg)     GENERAL:  Well nourished, well developed in no acute distress NECK: No JVD; No carotid bruits CARDIAC: RRR, S1 and S2 present, no murmurs, no rubs, no gallops CHEST:  Clear to auscultation without rales, wheezing or rhonchi  Extremities: No pitting pedal edema. Pulses bilaterally symmetric with radial 2+ and dorsalis pedis 2+ NEUROLOGIC:  Alert and oriented x 3  Medication Adjustments/Labs and Tests Ordered: Current medicines are reviewed at length with the patient today.  Concerns regarding medicines are outlined above.  Orders Placed This Encounter  Procedures   CT CORONARY MORPH W/CTA COR W/SCORE W/CA W/CM &/OR WO/CM   Basic Metabolic Panel (BMET)   EKG 12-Lead   ECHOCARDIOGRAM COMPLETE   Meds ordered this encounter  Medications   aspirin  EC 81 MG tablet    Sig: Take 1 tablet (81 mg total) by mouth daily. Swallow whole.    Dispense:  90 tablet    Refill:  3    Signed, Alexiz Sustaita reddy Lowella Kindley, MD, MPH, [facility]. 01/27/2024 9:00 AM    [facility]

## 2024-01-27 NOTE — Assessment & Plan Note (Signed)
 Lipid panel shows good control. Continue atorvastatin and Zetia.

## 2024-01-27 NOTE — Patient Instructions (Signed)
 Medication Instructions:  Your physician has recommended you make the following change in your medication:   START: Aspirin  81 mg daily  *If you need a refill on your cardiac medications before your next appointment, please call your pharmacy*  Lab Work: Your physician recommends that you return for lab work in:   Labs today: BMP  If you have labs (blood work) drawn today and your tests are completely normal, you will receive your results only by: MyChart Message (if you have MyChart) OR A paper copy in the mail If you have any lab test that is abnormal or we need to change your treatment, we will call you to review the results.  Testing/Procedures: Your physician has requested that you have an echocardiogram. Echocardiography is a painless test that uses sound waves to create images of your heart. It provides your doctor with information about the size and shape of your heart and how well your heart's chambers and valves are working. This procedure takes approximately one hour. There are no restrictions for this procedure. Please do NOT wear cologne, perfume, aftershave, or lotions (deodorant is allowed). Please arrive 15 minutes prior to your appointment time.  Please note: We ask at that you not bring children with you during ultrasound (echo/ vascular) testing. Due to room size and safety concerns, children are not allowed in the ultrasound rooms during exams. Our front office staff cannot provide observation of children in our lobby area while testing is being conducted. An adult accompanying a patient to their appointment will only be allowed in the ultrasound room at the discretion of the ultrasound technician under special circumstances. We apologize for any inconvenience.    Your cardiac CT will be scheduled at one of the below locations:   [facility] [address] [place], [place] [address] [phone] (Severe contrast allergies only)  OR   [facility] [address] [place], [place] [address] [phone]  OR   [facility] [address] [place], [place] [address] [phone]  OR   [facility] and Vascular Tower [address]  [place], [place] [address] [phone]  OR   [facility] [address] [place], [place] [phone]  If scheduled at [facility], please arrive at the [facility] and Children's Entrance (Entrance C2) of [facility] 30 minutes prior to test start time. You can use the FREE valet parking offered at entrance C (encouraged to control the heart rate for the test)  Proceed to the [facility] Radiology Department (first floor) to check-in and test prep.  All radiology patients and guests should use entrance C2 at [facility], accessed from [facility], even though the hospital's physical address listed is [address].  If scheduled at the Heart and Vascular Tower at [address], please enter the parking lot using the [address] entrance and use the FREE valet service at the patient drop-off area. Enter the building and check-in with registration on the main floor.  If scheduled at [facility], please arrive to the Heart and Vascular Center 15 mins early for check-in and test prep.  There is spacious parking and easy access to the radiology department from the [facility] Heart and Vascular entrance. Please enter here and check-in with the desk attendant.   If scheduled at [facility], please arrive 30 minutes early for check-in and test prep.  Please follow these instructions carefully (  unless otherwise directed):  An IV will be required for this test and Nitroglycerin will be given.  Hold all erectile dysfunction medications at least 3 days (72 hrs) prior to test. (Ie viagra, cialis, sildenafil, tadalafil, etc)   On the Night Before the Test: Be sure to Drink  plenty of water . Do not consume any caffeinated/decaffeinated beverages or chocolate 12 hours prior to your test. Do not take any antihistamines 12 hours prior to your test.  On the Day of the Test: Drink plenty of water  until 1 hour prior to the test. Do not eat any food 1 hour prior to test. You may take your regular medications prior to the test.  Take metoprolol  (Lopressor ) two hours prior to test. Patients who wear a continuous glucose monitor MUST remove the device prior to scanning.      After the Test: Drink plenty of water . After receiving IV contrast, you may experience a mild flushed feeling. This is normal. On occasion, you may experience a mild rash up to 24 hours after the test. This is not dangerous. If this occurs, you can take Benadryl 25 mg, Zyrtec, Claritin, or Allegra and increase your fluid intake. (Patients taking Tikosyn should avoid Benadryl, and may take Zyrtec, Claritin, or Allegra) If you experience trouble breathing, this can be serious. If it is severe call 911 IMMEDIATELY. If it is mild, please call our office.  We will call to schedule your test 2-4 weeks out understanding that some insurance companies will need an authorization prior to the service being performed.   For more information and frequently asked questions, please visit our website : [email or web address]  For non-scheduling related questions, please contact the cardiac imaging nurse navigator should you have any questions/concerns: Cardiac Imaging Nurse Navigators Direct Office Dial: [phone]   For scheduling needs, including cancellations and rescheduling, please call [place], [phone].   Follow-Up: At [facility], you and your health needs are our priority.  As part of our continuing mission to provide you with exceptional heart care, our providers are all part of one team.  This team includes your primary Cardiologist (physician) and Advanced Practice  Providers or APPs (Physician Assistants and Nurse Practitioners) who all work together to provide you with the care you need, when you need it.  Your next appointment:   2 month(s)  Provider:   Alean Kobus, MD    We recommend signing up for the patient portal called MyChart.  Sign up information is provided on this After Visit Summary.  MyChart is used to connect with patients for Virtual Visits (Telemedicine).  Patients are able to view lab/test results, encounter notes, upcoming appointments, etc.  Non-urgent messages can be sent to your provider as well.   To learn more about what you can do with MyChart, go to [email or web address].   Other Instructions None

## 2024-01-27 NOTE — Assessment & Plan Note (Signed)
 Likely related to diastolic dysfunction and in the setting of longstanding history of hypertension. Advised to cut down salt intake. Will assess with transthoracic echocardiogram.

## 2024-01-28 LAB — BASIC METABOLIC PANEL WITH GFR
BUN/Creatinine Ratio: 18 (ref 10–24)
BUN: 13 mg/dL (ref 8–27)
CO2: 21 mmol/L (ref 20–29)
Calcium: 9.9 mg/dL (ref 8.6–10.2)
Chloride: 103 mmol/L (ref 96–106)
Creatinine, Ser: 0.72 mg/dL — ABNORMAL LOW (ref 0.76–1.27)
Glucose: 180 mg/dL — ABNORMAL HIGH (ref 70–99)
Potassium: 4.8 mmol/L (ref 3.5–5.2)
Sodium: 139 mmol/L (ref 134–144)
eGFR: 100 mL/min/1.73 (ref >59)

## 2024-01-30 ENCOUNTER — Ambulatory Visit: Payer: Self-pay

## 2024-01-30 NOTE — Progress Notes (Signed)
 Please inform him precardiac CT blood work shows normal electrolytes and kidney function.

## 2024-02-17 ENCOUNTER — Telehealth (HOSPITAL_COMMUNITY): Payer: Self-pay | Admitting: *Deleted

## 2024-02-17 NOTE — Telephone Encounter (Signed)
 Reaching out to patient to offer assistance regarding upcoming cardiac imaging study; pt verbalizes understanding of appt date/time, parking situation and where to check in, pre-test NPO status and medications ordered, and verified current allergies; name and call back number provided for further questions should they arise  Chantal Requena RN Navigator Cardiac Imaging [facility] [phone] office [phone] cell

## 2024-02-18 ENCOUNTER — Ambulatory Visit (INDEPENDENT_AMBULATORY_CARE_PROVIDER_SITE_OTHER)
Admission: RE | Admit: 2024-02-18 | Discharge: 2024-02-18 | Disposition: A | Discharge status: Home / Self Care | Source: Ambulatory Visit

## 2024-02-18 DIAGNOSIS — R0609 Other forms of dyspnea: Secondary | ICD-10-CM

## 2024-02-18 DIAGNOSIS — E782 Mixed hyperlipidemia: Secondary | ICD-10-CM

## 2024-02-18 DIAGNOSIS — I700 Atherosclerosis of aorta: Secondary | ICD-10-CM | POA: Diagnosis not present

## 2024-02-18 DIAGNOSIS — Z794 Long term (current) use of insulin: Secondary | ICD-10-CM | POA: Diagnosis not present

## 2024-02-18 DIAGNOSIS — E119 Type 2 diabetes mellitus without complications: Secondary | ICD-10-CM | POA: Diagnosis not present

## 2024-02-18 MED ORDER — IOHEXOL 350 MG/ML SOLN
100.0000 mL | Freq: Once | INTRAVENOUS | Status: AC | PRN
  Administered 2024-02-18: 100 mL via INTRAVENOUS

## 2024-02-18 MED ORDER — NITROGLYCERIN 0.4 MG SL SUBL
0.8000 mg | SUBLINGUAL_TABLET | Freq: Once | SUBLINGUAL | Status: AC
  Administered 2024-02-18: 0.8 mg via SUBLINGUAL

## 2024-02-29 ENCOUNTER — Telehealth: Payer: Self-pay

## 2024-02-29 ENCOUNTER — Ambulatory Visit

## 2024-02-29 DIAGNOSIS — E782 Mixed hyperlipidemia: Secondary | ICD-10-CM

## 2024-02-29 DIAGNOSIS — R0609 Other forms of dyspnea: Secondary | ICD-10-CM

## 2024-02-29 LAB — ECHOCARDIOGRAM COMPLETE
AR max vel: 3.28 cm2
AV Area VTI: 3.15 cm2
AV Area mean vel: 3.05 cm2
AV Mean grad: 2 mmHg
AV Peak grad: 3.3 mmHg
Ao pk vel: 0.91 m/s
Area-P 1/2: 2.07 cm2
MV VTI: 1.41 cm2
S' Lateral: 3.3 cm

## 2024-02-29 NOTE — Telephone Encounter (Signed)
 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 Once completed notify the pt and send letter to Windle Shaver, NP at [facility].

## 2024-02-29 NOTE — Telephone Encounter (Signed)
 Patient is requesting a call back about a DOT physical information. CB #  [phone]

## 2024-03-01 NOTE — Telephone Encounter (Signed)
 Note has been sent

## 2024-03-03 ENCOUNTER — Inpatient Hospital Stay (HOSPITAL_BASED_OUTPATIENT_CLINIC_OR_DEPARTMENT_OTHER): Admission: RE | Admit: 2024-03-03 | Source: Ambulatory Visit | Admitting: Radiology

## 2024-03-16 ENCOUNTER — Telehealth: Payer: Self-pay

## 2024-03-16 NOTE — Telephone Encounter (Signed)
 Left message on My Chart with lab results per Dr. Karry note. Routed to PCP

## 2024-03-18 DIAGNOSIS — H25811 Combined forms of age-related cataract, right eye: Secondary | ICD-10-CM | POA: Diagnosis not present

## 2024-03-25 ENCOUNTER — Other Ambulatory Visit: Payer: Self-pay

## 2024-03-28 ENCOUNTER — Ambulatory Visit

## 2024-03-29 ENCOUNTER — Ambulatory Visit

## 2024-03-29 VITALS — BP 138/94 | HR 61 | Ht 71.0 in | Wt 223.2 lb

## 2024-03-29 DIAGNOSIS — E782 Mixed hyperlipidemia: Secondary | ICD-10-CM

## 2024-03-29 DIAGNOSIS — R0609 Other forms of dyspnea: Secondary | ICD-10-CM | POA: Diagnosis not present

## 2024-03-29 DIAGNOSIS — I10 Essential (primary) hypertension: Secondary | ICD-10-CM | POA: Diagnosis not present

## 2024-03-29 NOTE — Assessment & Plan Note (Signed)
 Does not appear to be of cardiac origin based on workup with echocardiogram and cardiac CT from October 2025.  With bronchial thickening noted on extracardiac findings on the cardiac CT, would recommend further pulmonary evaluation as clinically indicated. Will defer this to his PCP.

## 2024-03-29 NOTE — Patient Instructions (Signed)
 Medication Instructions:  Your physician recommends that you continue on your current medications as directed. Please refer to the Current Medication list given to you today.  *If you need a refill on your cardiac medications before your next appointment, please call your pharmacy*  Lab Work: None If you have labs (blood work) drawn today and your tests are completely normal, you will receive your results only by: MyChart Message (if you have MyChart) OR A paper copy in the mail If you have any lab test that is abnormal or we need to change your treatment, we will call you to review the results.  Testing/Procedures: None  Follow-Up: At [facility], you and your health needs are our priority.  As part of our continuing mission to provide you with exceptional heart care, our providers are all part of one team.  This team includes your primary Cardiologist (physician) and Advanced Practice Providers or APPs (Physician Assistants and Nurse Practitioners) who all work together to provide you with the care you need, when you need it.  Your next appointment:   Follow up as needed  Provider:   Huntley Dec, MD    We recommend signing up for the patient portal called "MyChart".  Sign up information is provided on this After Visit Summary.  MyChart is used to connect with patients for Virtual Visits (Telemedicine).  Patients are able to view lab/test results, encounter notes, upcoming appointments, etc.  Non-urgent messages can be sent to your provider as well.   To learn more about what you can do with MyChart, go to [email or web address].   Other Instructions None

## 2024-03-29 NOTE — Progress Notes (Signed)
 Cardiology Consultation:    Date:  03/29/2024   ID:  Dean Leon, DOB Oct 24, 1955, MRN [id number]  PCP:  Benjamine Lauraine DASEN, NP  Cardiologist:  Alean SAUNDERS Takeria Marquina, MD   Referring MD: Stephanie Charlene LITTIE, MD   No chief complaint on file.    ASSESSMENT AND PLAN:   Mr Troost 68 year old male   history of diabetes mellitus, hypertension, obesity, hyperlipidemia, hypothyroidism and depression.  Excess caffeinated soda consumption. Denies any prior history of CAD, CHF, MI, CVA. Reported with chronic symptoms of dyspnea on exertion over 20 years but with recent acute worsening further evaluated for his initial consult.   Recommend evaluation with echocardiogram 02/29/2024 and cardiac CT coronary angiogram 02/18/2024 noted no significant abnormalities, calcium score 0, CAD RADS 0 study. Extracardiac findings were notable for heterogenous pulmonary parenchyma and bronchial thickening which could reflect bronchitis.  Overall from a cardiac standpoint he has been doing well and stable.  Problem List Items Addressed This Visit     Dyspnea on exertion   Does not appear to be of cardiac origin based on workup with echocardiogram and cardiac CT from October 2025.  With bronchial thickening noted on extracardiac findings on the cardiac CT, would recommend further pulmonary evaluation as clinically indicated. Will defer this to his PCP.       Hypertension - Primary   Can monitor blood pressures at home. Target blood pressure below 130/80 mmHg. Continue lisinopril 5 mg once daily. Advised to monitor home blood pressures and follow-up with PCP.       Mixed hyperlipidemia   Given history of diabetes, target LDL below 70 mg/dL. Continue with atorvastatin 40 mg once daily and Zetia 10 mg once daily. Defer further follow-up with PCP.       Follow-up with us  in the office on an as-needed basis.    History of Present Illness:    Dean Leon is a 68 y.o. male who is being seen today for  follow-up visit. PCP Hamrick, Charlene LITTIE, MD. Last visit with me in the office was 01-27-2024.  history of diabetes mellitus, hypertension, obesity, hyperlipidemia, hypothyroidism and depression.  Excess caffeinated soda consumption. Denies any prior history of CAD, CHF, MI, CVA. Reported with chronic symptoms of dyspnea on exertion over 20 years but with recent acute worsening further evaluated for his initial consult.  echocardiogram 02/29/2024 notes normal biventricular function without significant valve abnormalities. - Cardiac CT coronary angiogram from 02/18/2024 shows no significant coronary atherosclerosis, calcium score 0, CAD RADS 0 study. Extracardiac findings are notable for heterogenous pulmonary parenchyma and bronchial thickening.  Could reflect findings of bronchitis. Also given his history of significant exposure to air pollutants, recommend further evaluation with PCP with regards to pulmonary causes of his dyspnea on exertion.    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 Employed full-time. He is a Research scientist (life sciences) and was in [facility] and reports exposure to significant air pollutant   Overall doing well. No active symptoms at this time. Reviewed workup and findings from echocardiogram and cardiac CT at length.  Reassured him about the findings.  Noted blood pressure readings to date to be elevated. Recommended he monitor these at home consistently.  He has a blood pressure cuff. Notes at recent [employer] physical blood pressures were normal.    Past Medical History:  Diagnosis Date  Bilateral lower extremity edema 01/27/2024   BMI 31.0-31.9,adult    Depression    Diabetes mellitus without complication (HCC)    Dyspnea on exertion    Full dentures    Hypertension    Hypothyroidism    Mixed hyperlipidemia    Obesity    Sleep  apnea    had a study awhile back but doesnt use CPAP   Thyroid  disorder    Type 2 diabetes mellitus with insulin therapy (HCC)    Wears glasses    sometimes 05/14/2021    Past Surgical History:  Procedure Laterality Date   CIRCUMCISION N/A 05/21/2021   Procedure: CIRCUMCISION ADULT;  Surgeon: Rosalind Zachary NOVAK, MD;  Location: [facility];  Service: Urology;  Laterality: N/A;    Current Medications: Current Meds  Medication Sig   aspirin  EC 81 MG tablet Take 1 tablet (81 mg total) by mouth daily. Swallow whole.   atorvastatin (LIPITOR) 40 MG tablet Take 1 tablet by mouth daily. Afternoon takes it   ezetimibe (ZETIA) 10 MG tablet Take 10 mg by mouth daily.   insulin glargine (LANTUS) 100 UNIT/ML injection Inject 70 Units into the skin at bedtime.   lisinopril (ZESTRIL) 5 MG tablet Take 5 mg by mouth daily.   metFORMIN (GLUCOPHAGE) 500 MG tablet Take 1,000 mg by mouth 2 (two) times daily.   pioglitazone (ACTOS) 45 MG tablet Take 45 mg by mouth daily.   SYNTHROID 88 MCG tablet Take 88 mcg by mouth daily.   venlafaxine XR (EFFEXOR-XR) 75 MG 24 hr capsule Take 75 mg by mouth daily.     Allergies:   Patient has no known allergies.   Social History   Socioeconomic History   Marital status: Married    Spouse name: Not on file   Number of children: Not on file   Years of education: Not on file   Highest education level: Not on file  Occupational History   Not on file  Tobacco Use   Smoking status: Never   Smokeless tobacco: Never  Vaping Use   Vaping status: Never Used  Substance and Sexual Activity   Alcohol use: Yes    Comment: social   Drug use: Never   Sexual activity: Not on file  Other Topics Concern   Not on file  Social History Narrative   Not on file   Social Drivers of Health   Financial Resource Strain: Not on file  Food Insecurity: Not on file  Transportation Needs: Not on file  Physical Activity: Not on file  Stress: Not on file  Social  Connections: Unknown (01/27/2023)   Received from [facility]   Social Network    Social Network: Not on file     Family History: The patient's family history includes Cancer in his paternal aunt and paternal uncle; Diabetes in his father, mother, paternal grandmother, and sister; Hypertension in his father and paternal grandmother. There is no history of Heart disease. ROS:   Please see the history of present illness.    All 14 point review of systems negative except as described per history of present illness.  EKGs/Labs/Other Studies Reviewed:    The following studies were reviewed today:   EKG:       Recent Labs: 01/27/2024: BUN 13; Creatinine, Ser 0.72; Potassium 4.8; Sodium 139  Recent Lipid Panel No results found for: CHOL, TRIG, HDL, CHOLHDL, VLDL, LDLCALC, LDLDIRECT  Physical Exam:    VS:  BP (!) 138/94   Pulse 61  Ht 5' 11 (1.803 m)   Wt 223 lb 3.2 oz (101.2 kg)   SpO2 96%   BMI 31.13 kg/m     Wt Readings from Last 3 Encounters:  03/29/24 223 lb 3.2 oz (101.2 kg)  01/27/24 231 lb 12.8 oz (105.1 kg)  05/21/21 227 lb 6.4 oz (103.1 kg)     GENERAL:  Well nourished, well developed in no acute distress NECK: No JVD; No carotid bruits CARDIAC: RRR, S1 and S2 present, no murmurs, no rubs, no gallops CHEST:  Clear to auscultation without rales, wheezing or rhonchi  Extremities: No pitting pedal edema. Pulses bilaterally symmetric with radial 2+ and dorsalis pedis 2+ NEUROLOGIC:  Alert and oriented x 3  Medication Adjustments/Labs and Tests Ordered: Current medicines are reviewed at length with the patient today.  Concerns regarding medicines are outlined above.  No orders of the defined types were placed in this encounter.  No orders of the defined types were placed in this encounter.   Signed, Alean jess Kobus, MD, MPH, [facility] 03/29/2024 1:44 PM    [facility]

## 2024-03-29 NOTE — Assessment & Plan Note (Signed)
 Given history of diabetes, target LDL below 70 mg/dL. Continue with atorvastatin 40 mg once daily and Zetia 10 mg once daily. Defer further follow-up with PCP.

## 2024-03-29 NOTE — Assessment & Plan Note (Signed)
 Can monitor blood pressures at home. Target blood pressure below 130/80 mmHg. Continue lisinopril 5 mg once daily. Advised to monitor home blood pressures and follow-up with PCP.

## 2024-04-08 DIAGNOSIS — Z01818 Encounter for other preprocedural examination: Secondary | ICD-10-CM | POA: Diagnosis not present

## 2024-04-18 DIAGNOSIS — H25811 Combined forms of age-related cataract, right eye: Secondary | ICD-10-CM | POA: Diagnosis not present

## 2024-05-02 DIAGNOSIS — H25812 Combined forms of age-related cataract, left eye: Secondary | ICD-10-CM | POA: Diagnosis not present

## 2024-09-30 ENCOUNTER — Ambulatory Visit: Admitting: Endocrinology
